# Patient Record
Sex: Female | Born: 1990 | Race: White | Hispanic: No | Marital: Single | Smoking: Never smoker
Health system: Southern US, Community
[De-identification: ages and names within clinical notes are randomized; demographics above are authoritative.]

---

## 2019-06-27 ENCOUNTER — Emergency Department: Payer: Self-pay

## 2019-06-27 ENCOUNTER — Encounter: Payer: Self-pay | Admitting: Emergency Medicine

## 2019-06-27 ENCOUNTER — Other Ambulatory Visit: Payer: Self-pay

## 2019-06-27 ENCOUNTER — Inpatient Hospital Stay
Admission: EM | Admit: 2019-06-27 | Discharge: 2019-06-30 | DRG: 563 | Disposition: A | Discharge status: Home / Self Care | Payer: Self-pay | Attending: Orthopedic Surgery | Admitting: Orthopedic Surgery

## 2019-06-27 DIAGNOSIS — W010XXA Fall on same level from slipping, tripping and stumbling without subsequent striking against object, initial encounter: Secondary | ICD-10-CM | POA: Diagnosis present

## 2019-06-27 DIAGNOSIS — S43004A Unspecified dislocation of right shoulder joint, initial encounter: Secondary | ICD-10-CM

## 2019-06-27 DIAGNOSIS — Y93E2 Activity, laundry: Secondary | ICD-10-CM

## 2019-06-27 DIAGNOSIS — Y92009 Unspecified place in unspecified non-institutional (private) residence as the place of occurrence of the external cause: Secondary | ICD-10-CM

## 2019-06-27 DIAGNOSIS — R202 Paresthesia of skin: Secondary | ICD-10-CM | POA: Diagnosis present

## 2019-06-27 DIAGNOSIS — S43014A Anterior dislocation of right humerus, initial encounter: Secondary | ICD-10-CM | POA: Diagnosis present

## 2019-06-27 DIAGNOSIS — Z419 Encounter for procedure for purposes other than remedying health state, unspecified: Secondary | ICD-10-CM

## 2019-06-27 DIAGNOSIS — S4291XA Fracture of right shoulder girdle, part unspecified, initial encounter for closed fracture: Principal | ICD-10-CM | POA: Diagnosis present

## 2019-06-27 DIAGNOSIS — Z20822 Contact with and (suspected) exposure to covid-19: Secondary | ICD-10-CM | POA: Diagnosis present

## 2019-06-27 DIAGNOSIS — S42141A Displaced fracture of glenoid cavity of scapula, right shoulder, initial encounter for closed fracture: Secondary | ICD-10-CM | POA: Diagnosis present

## 2019-06-27 MED ORDER — LIDOCAINE HCL (PF) 1 % IJ SOLN
5.0000 mL | Freq: Once | INTRAMUSCULAR | Status: AC
  Administered 2019-06-27: 23:00:00 5 mL via INTRADERMAL
  Filled 2019-06-27: qty 5

## 2019-06-27 MED ORDER — MORPHINE SULFATE (PF) 4 MG/ML IV SOLN
4.0000 mg | Freq: Once | INTRAVENOUS | Status: AC
  Administered 2019-06-28: 4 mg via INTRAVENOUS

## 2019-06-27 MED ORDER — MORPHINE SULFATE (PF) 4 MG/ML IV SOLN
4.0000 mg | INTRAVENOUS | Status: DC | PRN
  Administered 2019-06-27 (×2): 4 mg via INTRAVENOUS
  Filled 2019-06-27 (×3): qty 1

## 2019-06-27 MED ORDER — ETOMIDATE 2 MG/ML IV SOLN
0.1500 mg/kg | Freq: Once | INTRAVENOUS | Status: AC
  Administered 2019-06-27: 8.86 mg via INTRAVENOUS
  Filled 2019-06-27: qty 10

## 2019-06-27 MED ORDER — ONDANSETRON HCL 4 MG/2ML IJ SOLN
4.0000 mg | Freq: Once | INTRAMUSCULAR | Status: AC
  Administered 2019-06-27: 4 mg via INTRAVENOUS
  Filled 2019-06-27: qty 2

## 2019-06-27 NOTE — ED Notes (Signed)
2 unsuccessful IV attempts by this RN (left lateral and medial AC).

## 2019-06-27 NOTE — ED Triage Notes (Signed)
Patient ambulatory to triage with steady gait, without difficulty or distress noted, mask in place; pt reports tripped carrying laundry, stuck rt arm out and injured rt shoulder; denies any other c/o or injuries

## 2019-06-27 NOTE — ED Provider Notes (Signed)
[facility] Emergency Department Provider Note    First MD Initiated Contact with Patient 06/27/19 2208     (approximate)  I have reviewed the triage vital signs and the nursing notes.   HISTORY  Chief Complaint Shoulder Injury    HPI Adrienne [name] is a 29 y.o. female presents to the ER for acute right shoulder pain that occurred a mechanical fall falling onto her right outstretched arm while carrying laundry.  Denies any head injury.  Rates the pain currently is mild to moderate.  Did not take anything for pain.  Denies any numbness or tingling.  She is right-hand dominant.  States she does have a remote history of surgery to the right shoulder.    History reviewed. No pertinent past medical history. No family history on file. History reviewed. No pertinent surgical history. There are no problems to display for this patient.     Prior to Admission medications   Not on File    Allergies Patient has no known allergies.    Social History Social History   Tobacco Use  . Smoking status: Never Smoker  . Smokeless tobacco: Never Used  Substance Use Topics  . Alcohol use: Not on file  . Drug use: Not on file    Review of Systems Patient denies headaches, rhinorrhea, blurry vision, numbness, shortness of breath, chest pain, edema, cough, abdominal pain, nausea, vomiting, diarrhea, dysuria, fevers, rashes or hallucinations unless otherwise stated above in HPI. ____________________________________________   PHYSICAL EXAM:  VITAL SIGNS: Vitals:   06/27/19 2358 06/28/19 0007  BP: (!) 137/98 94/82  Pulse: 88 81  Resp: 19 12  Temp: 97.9 F (36.6 C)   SpO2: 100% 100%    Constitutional: Alert and oriented.  Eyes: Conjunctivae are normal.  Head: Atraumatic. Nose: No congestion/rhinnorhea. Mouth/Throat: Mucous membranes are moist.   Neck: No stridor. Painless ROM.  Cardiovascular: Normal rate, regular rhythm. Grossly normal heart  sounds.  Good peripheral circulation. Respiratory: Normal respiratory effort.  No retractions. Lungs CTAB. Gastrointestinal: Soft and nontender.  Genitourinary:  Musculoskeletal: Right shoulder with deformity noted.  No effusion.  Neurovascular intact distally.  No elbow pain.  No lower extremity tenderness nor edema.  No joint effusions. Neurologic:  Normal speech and language. No gross focal neurologic deficits are appreciated. No facial droop Skin:  Skin is warm, dry and intact. No rash noted. Psychiatric: Mood and affect are normal. Speech and behavior are normal.  ____________________________________________   LABS (all labs ordered are listed, but only abnormal results are displayed)  No results found for this or any previous visit (from the past 24 hour(s)). ____________________________________________  EKG____________________________________________  RADIOLOGY  I personally reviewed all radiographic images ordered to evaluate for the above acute complaints and reviewed radiology reports and findings.  These findings were personally discussed with the patient.  Please see medical record for radiology report.  ____________________________________________   PROCEDURES  Procedure(s) performed:  .Ortho Injury Treatment  Date/Time: 06/28/2019 12:24 AM Performed by: Willy Eddy, MD Authorized by: Willy Eddy, MD   Consent:    Consent obtained:  Written   Consent given by:  Patient   Risks discussed:  Irreducible dislocation, fracture, recurrent dislocation, stiffness, restricted joint movement, vascular damage and nerve damage   Alternatives discussed:  Alternative treatment, immobilization, referral, no treatment and delayed treatmentInjury location: shoulder Location details: right shoulder Injury type: dislocation Dislocation type: inferior Hill-Sachs deformity: no Chronicity: new Pre-procedure neurovascular assessment: neurovascularly intact Pre-procedure  distal perfusion: normal Pre-procedure neurological  function: normal Pre-procedure range of motion: reduced  Anesthesia: Local anesthesia used: no  Patient sedated: Yes. Refer to sedation procedure documentation for details of sedation. Manipulation performed: yes Reduction method: external rotation, Milch technique, scapular manipulation, traction and counter traction and Stimson maneuver Reduction successful: no Post-procedure distal perfusion: normal Post-procedure neurological function: normal Post-procedure range of motion: unchanged  .Sedation  Date/Time: 06/28/2019 12:25 AM Performed by: Merlyn Lot, MD Authorized by: Merlyn Lot, MD   Consent:    Consent obtained:  Written (electronic informed consent)   Risks discussed:  Allergic reaction, dysrhythmia, inadequate sedation, nausea, vomiting, respiratory compromise necessitating ventilatory assistance and intubation, prolonged sedation necessitating reversal and prolonged hypoxia resulting in organ damage Universal protocol:    Procedure explained and questions answered to patient or proxy's satisfaction: yes     Relevant documents present and verified: yes     Test results available and properly labeled: yes     Imaging studies available: yes     Required blood products, implants, devices, and special equipment available: yes     Immediately prior to procedure a time out was called: yes     Patient identity confirmation method:  Arm band Pre-sedation assessment:    Time since last food or drink:  6   ASA classification: class 1 - normal, healthy patient     Neck mobility: normal     Mouth opening:  2 finger widths   Thyromental distance:  2 finger widths   Mallampati score:  II - soft palate, uvula, fauces visible   Pre-sedation assessments completed and reviewed: airway patency, cardiovascular function, hydration status, mental status, nausea/vomiting, pain level, respiratory function and temperature     Immediate pre-procedure details:    Reassessment: Patient reassessed immediately prior to procedure     Reviewed: vital signs, relevant labs/tests and NPO status     Verified: bag valve mask available, emergency equipment available, intubation equipment available, IV patency confirmed, oxygen available, reversal medications available and suction available   Procedure details (see MAR for exact dosages):    Intended level of sedation: moderate (conscious sedation)   Intra-procedure monitoring:  Blood pressure monitoring, continuous pulse oximetry, cardiac monitor, frequent vital sign checks and frequent LOC assessments   Total Provider sedation time (minutes):  5 Post-procedure details:    Attendance: Constant attendance by certified staff until patient recovered     Recovery: Patient returned to pre-procedure baseline     Post-sedation assessments completed and reviewed: airway patency, cardiovascular function, hydration status, mental status and respiratory function     Patient is stable for discharge or admission: yes     Patient tolerance:  Tolerated well, no immediate complications      Critical Care performed: no ____________________________________________   INITIAL IMPRESSION / ASSESSMENT AND PLAN / ED COURSE  Pertinent labs & imaging results that were available during my care of the patient were reviewed by me and considered in my medical decision making (see chart for details).   DDX: fracture, dislocation, contusion  Fara Worthy is a 29 y.o. who presents to the ED with evidence of right shoulder dislocation as described above.  She is neurovascularly intact.  No other associated injury.  IV pain medication given attempted scapular manipulation as well as Stimson maneuver however after repeated attempts patient was unable to be relocated.  Was able to be temporarily relocated but it did not seem to stay in place therefore we discussed options for treatment including  splinting for referral additional pain medication for  additional reduction attempts versus sedation.  Patient consented to proceed with sedation.  Clinical Course as of Jun 27 25  Fri Jun 28, 2019  0020 Patient with persistent dislocation that I am unable to maintain in position.  We can successfully get relocated temporarily but that seems like there is nothing holding it in place.  She remains neurovascularly intact do not see any evidence of clear fracture of the proximal humerus possible glenoid impaction fracture.  Discussed case with Dr. Martha Clan of orthopedics who agrees to admit for operative management.   [PR]    Clinical Course User Index [PR] Willy Eddy, MD    The patient was evaluated in Emergency Department today for the symptoms described in the history of present illness. He/she was evaluated in the context of the global COVID-19 pandemic, which necessitated consideration that the patient might be at risk for infection with the SARS-CoV-2 virus that causes COVID-19. Institutional protocols and algorithms that pertain to the evaluation of patients at risk for COVID-19 are in a state of rapid change based on information released by regulatory bodies including the CDC and federal and state organizations. These policies and algorithms were followed during the patient's care in the ED.  As part of my medical decision making, I reviewed the following data within the electronic MEDICAL RECORD NUMBER Nursing notes reviewed and incorporated, Labs reviewed, notes from prior ED visits and [place] Controlled Substance Database   ____________________________________________   FINAL CLINICAL IMPRESSION(S) / ED DIAGNOSES  Final diagnoses:  Traumatic closed displaced fracture of right shoulder with anterior dislocation, initial encounter      NEW MEDICATIONS STARTED DURING THIS VISIT:  New Prescriptions   No medications on file     Note:  This document was prepared using Dragon voice  recognition software and may include unintentional dictation errors.    Willy Eddy, MD 06/28/19 [phone]

## 2019-06-28 ENCOUNTER — Observation Stay: Payer: Self-pay

## 2019-06-28 ENCOUNTER — Observation Stay: Payer: Self-pay | Admitting: Anesthesiology

## 2019-06-28 ENCOUNTER — Encounter: Payer: Self-pay | Admitting: Orthopedic Surgery

## 2019-06-28 ENCOUNTER — Emergency Department: Payer: Self-pay

## 2019-06-28 ENCOUNTER — Encounter
Admission: EM | Disposition: A | Discharge status: Home / Self Care | Payer: Self-pay | Source: Home / Self Care | Attending: Orthopedic Surgery

## 2019-06-28 DIAGNOSIS — S43014A Anterior dislocation of right humerus, initial encounter: Secondary | ICD-10-CM | POA: Diagnosis present

## 2019-06-28 HISTORY — PX: SHOULDER CLOSED REDUCTION: SHX1051

## 2019-06-28 LAB — COMPREHENSIVE METABOLIC PANEL
ALT: 8 U/L (ref 0–44)
AST: 16 U/L (ref 15–41)
Albumin: 4 g/dL (ref 3.5–5.0)
Alkaline Phosphatase: 76 U/L (ref 38–126)
Anion gap: 6 (ref 5–15)
BUN: 9 mg/dL (ref 6–20)
CO2: 24 mmol/L (ref 22–32)
Calcium: 8.8 mg/dL — ABNORMAL LOW (ref 8.9–10.3)
Chloride: 108 mmol/L (ref 98–111)
Creatinine, Ser: 0.48 mg/dL (ref 0.44–1.00)
GFR calc Af Amer: >60 mL/min (ref >60)
GFR calc non Af Amer: >60 mL/min (ref >60)
Glucose, Bld: 99 mg/dL (ref 70–99)
Potassium: 4.1 mmol/L (ref 3.5–5.1)
Sodium: 138 mmol/L (ref 135–145)
Total Bilirubin: 0.4 mg/dL (ref 0.3–1.2)
Total Protein: 6.5 g/dL (ref 6.5–8.1)

## 2019-06-28 LAB — CBC WITH DIFFERENTIAL/PLATELET
Abs Immature Granulocytes: 0.01 10*3/uL (ref 0.00–0.07)
Basophils Absolute: 0 10*3/uL (ref 0.0–0.1)
Basophils Relative: 1 %
Eosinophils Absolute: 0.1 10*3/uL (ref 0.0–0.5)
Eosinophils Relative: 2 %
HCT: 32.1 % — ABNORMAL LOW (ref 36.0–46.0)
Hemoglobin: 9.9 g/dL — ABNORMAL LOW (ref 12.0–15.0)
Immature Granulocytes: 0 %
Lymphocytes Relative: 35 %
Lymphs Abs: 2.3 10*3/uL (ref 0.7–4.0)
MCH: 27.7 pg (ref 26.0–34.0)
MCHC: 30.8 g/dL (ref 30.0–36.0)
MCV: 89.7 fL (ref 80.0–100.0)
Monocytes Absolute: 0.3 10*3/uL (ref 0.1–1.0)
Monocytes Relative: 4 %
Neutro Abs: 3.8 10*3/uL (ref 1.7–7.7)
Neutrophils Relative %: 58 %
Platelets: 473 10*3/uL — ABNORMAL HIGH (ref 150–400)
RBC: 3.58 MIL/uL — ABNORMAL LOW (ref 3.87–5.11)
RDW: 14.5 % (ref 11.5–15.5)
WBC: 6.6 10*3/uL (ref 4.0–10.5)
nRBC: 0 % (ref 0.0–0.2)

## 2019-06-28 LAB — PREGNANCY, URINE: Preg Test, Ur: NEGATIVE

## 2019-06-28 LAB — SARS CORONAVIRUS 2 (TAT 6-24 HRS): SARS Coronavirus 2: NEGATIVE

## 2019-06-28 SURGERY — CLOSED REDUCTION, SHOULDER
Anesthesia: General | Site: Shoulder | Laterality: Right

## 2019-06-28 MED ORDER — HYDROMORPHONE HCL 1 MG/ML IJ SOLN
1.0000 mg | INTRAMUSCULAR | Status: DC | PRN
  Administered 2019-06-28: 1 mg via INTRAVENOUS
  Filled 2019-06-28: qty 1

## 2019-06-28 MED ORDER — LACTATED RINGERS IV BOLUS
1000.0000 mL | Freq: Once | INTRAVENOUS | Status: AC
  Administered 2019-06-28: 1000 mL via INTRAVENOUS

## 2019-06-28 MED ORDER — ONDANSETRON HCL 4 MG/2ML IJ SOLN: 4.0000 mg | Freq: Once | INTRAMUSCULAR | Status: DC | PRN

## 2019-06-28 MED ORDER — FENTANYL CITRATE (PF) 100 MCG/2ML IJ SOLN
INTRAMUSCULAR | Status: DC | PRN
  Administered 2019-06-28 (×2): 50 ug via INTRAVENOUS

## 2019-06-28 MED ORDER — MIDAZOLAM HCL 2 MG/2ML IJ SOLN
INTRAMUSCULAR | Status: DC | PRN
  Administered 2019-06-28: 2 mg via INTRAVENOUS

## 2019-06-28 MED ORDER — SODIUM CHLORIDE 0.9 % IV SOLN: INTRAVENOUS | Status: DC

## 2019-06-28 MED ORDER — HYDROMORPHONE HCL 1 MG/ML IJ SOLN
1.0000 mg | INTRAMUSCULAR | Status: DC | PRN
  Administered 2019-06-28 – 2019-06-30 (×20): 1 mg via INTRAVENOUS
  Filled 2019-06-28 (×23): qty 1

## 2019-06-28 MED ORDER — ACETAMINOPHEN 325 MG PO TABS: 650.0000 mg | ORAL_TABLET | Freq: Four times a day (QID) | ORAL | Status: DC | PRN

## 2019-06-28 MED ORDER — SODIUM CHLORIDE 0.9 % IV SOLN: INTRAVENOUS | Status: DC | PRN

## 2019-06-28 MED ORDER — OXYCODONE HCL 5 MG PO TABS
ORAL_TABLET | ORAL | Status: AC
  Filled 2019-06-28: qty 1

## 2019-06-28 MED ORDER — MORPHINE SULFATE (PF) 2 MG/ML IV SOLN: 2.0000 mg | INTRAVENOUS | Status: DC | PRN

## 2019-06-28 MED ORDER — PROPOFOL 10 MG/ML IV BOLUS
INTRAVENOUS | Status: DC | PRN
  Administered 2019-06-28: 120 mg via INTRAVENOUS
  Administered 2019-06-28: 180 mg via INTRAVENOUS

## 2019-06-28 MED ORDER — OXYCODONE HCL 5 MG PO TABS
5.0000 mg | ORAL_TABLET | Freq: Once | ORAL | Status: AC | PRN
  Administered 2019-06-28: 5 mg via ORAL

## 2019-06-28 MED ORDER — OXYCODONE HCL 5 MG/5ML PO SOLN: 5.0000 mg | Freq: Once | ORAL | Status: AC | PRN

## 2019-06-28 MED ORDER — FENTANYL CITRATE (PF) 100 MCG/2ML IJ SOLN
INTRAMUSCULAR | Status: AC
  Administered 2019-06-28: 15:00:00 25 ug via INTRAVENOUS
  Filled 2019-06-28: qty 2

## 2019-06-28 MED ORDER — OXYCODONE HCL 5 MG PO TABS
5.0000 mg | ORAL_TABLET | ORAL | Status: DC | PRN
  Administered 2019-06-28: 10 mg via ORAL
  Administered 2019-06-28: 5 mg via ORAL
  Administered 2019-06-29 (×2): 10 mg via ORAL
  Filled 2019-06-28 (×3): qty 2
  Filled 2019-06-28: qty 1

## 2019-06-28 MED ORDER — FENTANYL CITRATE (PF) 100 MCG/2ML IJ SOLN
INTRAMUSCULAR | Status: AC
  Filled 2019-06-28: qty 2

## 2019-06-28 MED ORDER — ACETAMINOPHEN 10 MG/ML IV SOLN: 1000.0000 mg | Freq: Once | INTRAVENOUS | Status: DC | PRN

## 2019-06-28 MED ORDER — FENTANYL CITRATE (PF) 100 MCG/2ML IJ SOLN
25.0000 ug | INTRAMUSCULAR | Status: AC | PRN
  Administered 2019-06-28 (×4): 25 ug via INTRAVENOUS

## 2019-06-28 MED ORDER — ETOMIDATE 2 MG/ML IV SOLN
8.8600 mg | Freq: Once | INTRAVENOUS | Status: AC
  Administered 2019-06-28: 8.86 mg via INTRAVENOUS

## 2019-06-28 MED ORDER — MIDAZOLAM HCL 2 MG/2ML IJ SOLN
INTRAMUSCULAR | Status: AC
  Filled 2019-06-28: qty 2

## 2019-06-28 MED ORDER — MORPHINE BOLUS VIA INFUSION: 2.0000 mg | INTRAVENOUS | Status: DC | PRN

## 2019-06-28 MED ORDER — FENTANYL CITRATE (PF) 100 MCG/2ML IJ SOLN
INTRAMUSCULAR | Status: AC
  Administered 2019-06-28: 16:00:00 25 ug via INTRAVENOUS
  Filled 2019-06-28: qty 2

## 2019-06-28 MED ORDER — DIPHENHYDRAMINE HCL 25 MG PO CAPS: 25.0000 mg | ORAL_CAPSULE | Freq: Four times a day (QID) | ORAL | Status: DC | PRN

## 2019-06-28 MED ORDER — SUCCINYLCHOLINE CHLORIDE 20 MG/ML IJ SOLN
INTRAMUSCULAR | Status: DC | PRN
  Administered 2019-06-28: 100 mg via INTRAVENOUS

## 2019-06-28 SURGICAL SUPPLY — 7 items
CRADLE LAMINECT ARM (MISCELLANEOUS) ×6 IMPLANT
KIT TURNOVER KIT A (KITS) ×3 IMPLANT
MASK FACE SPIDER DISP (MASK) ×3 IMPLANT
SLING ARM LRG DEEP (SOFTGOODS) IMPLANT
SLING ARM M TX990204 (SOFTGOODS) IMPLANT
SLING ULTRA II M (MISCELLANEOUS) IMPLANT
STRAP SAFETY 5IN WIDE (MISCELLANEOUS) ×3 IMPLANT

## 2019-06-28 NOTE — H&P (Signed)
PREOPERATIVE H&P  Chief Complaint: Right Dislocated Shoulder with glenoid fracture  HPI: Adrienne [name] is a 29 y.o. female who presents for preoperative history and physical with a diagnosis of a right dislocated shoulder after a fall last night at home.  Patient had close reduction attempts of her right shoulder by the ER staff.  Orthopedics was consulted when the shoulder reduction could not be maintained.  A CT scan in the emergency department following reductions demonstrate the shoulder remains inferiorly subluxed.  There is comminuted fracture of the inferior glenoid contributing to the subluxation.  Patient continues to complain of right shoulder pain and feels that her shoulder remains dislocated.     History reviewed. No pertinent past medical history. History reviewed. No pertinent surgical history. Social History   Socioeconomic History  . Marital status: Single    Spouse name: Not on file  . Number of children: Not on file  . Years of education: Not on file  . Highest education level: Not on file  Occupational History  . Not on file  Tobacco Use  . Smoking status: Never Smoker  . Smokeless tobacco: Never Used  Substance and Sexual Activity  . Alcohol use: Not on file  . Drug use: Not on file  . Sexual activity: Not on file  Other Topics Concern  . Not on file  Social History Narrative  . Not on file   Social Determinants of Health   Financial Resource Strain:   . Difficulty of Paying Living Expenses:   Food Insecurity:   . Worried About Charity fundraiser in the Last Year:   . Arboriculturist in the Last Year:   Transportation Needs:   . Film/video editor (Medical):   Marland Kitchen Lack of Transportation (Non-Medical):   Physical Activity:   . Days of Exercise per Week:   . Minutes of Exercise per Session:   Stress:   . Feeling of Stress :   Social Connections:   . Frequency of Communication with Friends and Family:   . Frequency of Social Gatherings with  Friends and Family:   . Attends Religious Services:   . Active Member of Clubs or Organizations:   . Attends Archivist Meetings:   Marland Kitchen Marital Status:    History reviewed. No pertinent family history. No Known Allergies Prior to Admission medications   Not on File     Positive ROS: All other systems have been reviewed and were otherwise negative with the exception of those mentioned in the HPI and as above.  Physical Exam: General: Patient tearful but alert, no acute distress Cardiovascular: Regular rate and rhythm, no murmurs rubs or gallops.  No pedal edema Respiratory: Clear to auscultation bilaterally, no wheezes rales or rhonchi. No cyanosis, no use of accessory musculature GI: No organomegaly, abdomen is soft and non-tender nondistended with positive bowel sounds. Skin: Skin intact, no lesions within the operative field. Neurologic: Sensation intact distally Psychiatric: Patient is competent for consent with normal mood and affect Lymphatic: No cervical lymphadenopathy  MUSCULOSKELETAL: Right shoulder: Patient has previous arthroscopy incisions which are well-healed.  Patient demonstrates intact skin.  There is no obvious deformity to the right shoulder.  Patient has a sling and swath in place.  Patient has diminished sensation to light touch over the axillary nerve distribution.  Distally the patient can flex and extend all five digits of her right hand.  She can flex and extend her wrist.  Patient has intact sensation light touch  throughout all five fingers of the right hand.  Her hand is well-perfused and she has a palpable radial pulse.  Assessment: Right Shoulder fracture dislocation  Plan: Plan for Procedure(s): CLOSED REDUCTION RIGHT SHOULDER  I reviewed the CT scan and plain x-rays.  I have discussed this case with my partners as this is a very challenging problem.  Patient has a comminuted fracture of the inferior glenoid.  This is contributing to the  patient's inferior subluxation.  I have reviewed plain film and CT images with the patient at the bedside.  I have diagrammed on the white board in her room the issues with her fracture and how it is contributing to inferior subluxation of the shoulder.  The patient wants to proceed with an attempted closed reduction under anesthesia.  She understands that I have doubts as to whether it is possible to improve the right shoulder position given her glenoid fracture and likely deltoid weakness, as exhibited by the diminished sensation light touch over the axillary nerve distribution.  Nevertheless I have agreed to attempt the reduction.  I will adjust her sling and swath in the ER to help improve the shoulder support.  I spoken with one of my colleagues at [facility], Dr. Vale Haven who is agreed to see the patient in follow-up on Monday.  He will discuss further treatment options including surgical options for the patient at her office visit.    Juanell Fairly, MD   06/28/2019 1:51 PM

## 2019-06-28 NOTE — ED Notes (Signed)
Shoulder immobilizer applied to pt. PT is A&Ox4 in NAD. Respirations reg, unlab

## 2019-06-28 NOTE — Progress Notes (Signed)
Pt states she is 8 out of 10  When not stimulated she is able to sleep  Facial pain level 0 out of 10 upon departure to 156

## 2019-06-28 NOTE — Progress Notes (Addendum)
Subjective:  POSTOP CHECK:  Patient sitting upright in bed.   She is crying.  She complains of significant pain in the right shoulder.  Patient had improvement in the inferior subluxation of her right shoulder.  Unfortunately the reduction was lost in the PACU.  Objective:   VITALS:   Vitals:   06/28/19 1551 06/28/19 1552 06/28/19 1602 06/28/19 1640  BP:  108/63 107/67 128/81  Pulse: 72 (!) 59 66 73  Resp: 12 10 13 20   Temp:  98.2 F (36.8 C)  98.1 F (36.7 C)  TempSrc:    Oral  SpO2: 99% 97% 98% 97%  Weight:      Height:        PHYSICAL EXAM: right extremity in sling and swath.  Skin intact.  Moves all fingers. Neurovascular intact.  Still has paresthesias over lateral right shoulder in axillary nerve distribution which was present prior to her close reduction.  LABS  Results for orders placed or performed during the hospital encounter of 06/27/19 (from the past 24 hour(s))  CBC with Differential/Platelet     Status: Abnormal   Collection Time: 06/28/19 12:34 AM  Result Value Ref Range   WBC 6.6 4.0 - 10.5 K/uL   RBC 3.58 (L) 3.87 - 5.11 MIL/uL   Hemoglobin 9.9 (L) 12.0 - 15.0 g/dL   HCT 32.1 (L) 36.0 - 46.0 %   MCV 89.7 80.0 - 100.0 fL   MCH 27.7 26.0 - 34.0 pg   MCHC 30.8 30.0 - 36.0 g/dL   RDW 14.5 11.5 - 15.5 %   Platelets 473 (H) 150 - 400 K/uL   nRBC 0.0 0.0 - 0.2 %   Neutrophils Relative % 58 %   Neutro Abs 3.8 1.7 - 7.7 K/uL   Lymphocytes Relative 35 %   Lymphs Abs 2.3 0.7 - 4.0 K/uL   Monocytes Relative 4 %   Monocytes Absolute 0.3 0.1 - 1.0 K/uL   Eosinophils Relative 2 %   Eosinophils Absolute 0.1 0.0 - 0.5 K/uL   Basophils Relative 1 %   Basophils Absolute 0.0 0.0 - 0.1 K/uL   Immature Granulocytes 0 %   Abs Immature Granulocytes 0.01 0.00 - 0.07 K/uL  Comprehensive metabolic panel     Status: Abnormal   Collection Time: 06/28/19 12:34 AM  Result Value Ref Range   Sodium 138 135 - 145 mmol/L   Potassium 4.1 3.5 - 5.1 mmol/L   Chloride 108 98 -  111 mmol/L   CO2 24 22 - 32 mmol/L   Glucose, Bld 99 70 - 99 mg/dL   BUN 9 6 - 20 mg/dL   Creatinine, Ser 0.48 0.44 - 1.00 mg/dL   Calcium 8.8 (L) 8.9 - 10.3 mg/dL   Total Protein 6.5 6.5 - 8.1 g/dL   Albumin 4.0 3.5 - 5.0 g/dL   AST 16 15 - 41 U/L   ALT 8 0 - 44 U/L   Alkaline Phosphatase 76 38 - 126 U/L   Total Bilirubin 0.4 0.3 - 1.2 mg/dL   GFR calc non Af Amer >60 >60 mL/min   GFR calc Af Amer >60 >60 mL/min   Anion gap 6 5 - 15  SARS CORONAVIRUS 2 (TAT 6-24 HRS) Nasopharyngeal Nasopharyngeal Swab     Status: None   Collection Time: 06/28/19 12:34 AM   Specimen: Nasopharyngeal Swab  Result Value Ref Range   SARS Coronavirus 2 NEGATIVE NEGATIVE  Pregnancy, urine     Status: None   Collection Time: 06/28/19  7:45 AM  Result Value Ref Range   Preg Test, Ur NEGATIVE NEGATIVE    DG Shoulder Right  Result Date: 06/28/2019 CLINICAL DATA:  Anterior dislocation of right shoulder. EXAM: RIGHT SHOULDER - 2+ VIEW COMPARISON:  Multiple x-rays since June 27, 2019. FINDINGS: There is significant inferior subluxation of the humeral head, increased since the most recent comparison study. The patient has known inferior glenoid fracture/Bankart lesion is less well appreciated on this single AP view. No anterior or posterior dislocation is identified. IMPRESSION: Significant inferior subluxation of the humeral head, increased since the most recent comparison. No evidence of anterior or posterior dislocation. The patient has known Bankart lesion/fracture is not well appreciated on this study. Electronically Signed   By: Gerome Sam III M.D   On: 06/28/2019 16:51   DG Shoulder Right  Result Date: 06/28/2019 CLINICAL DATA:  Right shoulder dislocation. EXAM: DG C-ARM 1-60 MIN; RIGHT SHOULDER - 2+ VIEW COMPARISON:  Right shoulder radiographs and CT earlier today FLUOROSCOPY TIME:  Fluoroscopy Time:  2.7 seconds Radiation Exposure Index (if provided by the fluoroscopic device): 0.2055 mGy Number of  Acquired Spot Images: 1 FINDINGS: A single spot fluoroscopic image of the right shoulder in the frontal projection demonstrates mild residual inferior subluxation of the humeral head relative to the glenoid, much improved from the prior radiographs. Inferior glenoid fracture is again noted. IMPRESSION: Decreased inferior subluxation of the humeral head relative to the glenoid. Electronically Signed   By: Sebastian Ache M.D.   On: 06/28/2019 15:38   DG Shoulder Right  Result Date: 06/28/2019 CLINICAL DATA:  Preoperative examination for patient with a right shoulder dislocation and glenoid fracture. Initial encounter. EXAM: RIGHT SHOULDER - 2+ VIEW COMPARISON:  CT right shoulder and plain films right shoulder 06/28/2019. FINDINGS: Marked inferior subluxation of the humeral head is unchanged. Impaction fraction inferior glenoid noted. No new abnormality. IMPRESSION: No change in marked inferior subluxation the humeral head on the glenoid and an impaction fracture of the inferior glenoid. Electronically Signed   By: Drusilla Kanner M.D.   On: 06/28/2019 13:42   DG Shoulder Right  Result Date: 06/27/2019 CLINICAL DATA:  Tripped and fell EXAM: RIGHT SHOULDER - 2+ VIEW COMPARISON:  None. FINDINGS: Frontal and transscapular views of the right shoulder demonstrate anterior and inferior glenohumeral dislocation. There is irregularity along the inferior margin of the glenoid suspicious for impaction fracture. Right chest is clear. IMPRESSION: 1. 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 Electronically Signed   By: Sharlet Salina M.D.   On: 06/27/2019 21:51   CT SHOULDER RIGHT WO CONTRAST  Result Date: 06/28/2019 CLINICAL DATA:  Evaluate fracture after dislocation. EXAM: CT OF THE UPPER RIGHT EXTREMITY WITHOUT CONTRAST TECHNIQUE: Multidetector CT imaging of the upper right extremity was performed according to the standard protocol. COMPARISON:  Radiographs earlier today.  FINDINGS: Bones/Joint/Cartilage Inferior subluxation of the humeral head with respect to the glenoid, this may be in part related to joint effusion. Comminuted fracture of the inferior glenoid involving the anterior-inferior as well as inferior cortex. There is some irregularity of the posteroinferior cortex well. No definite Hill-Sachs injury to the humeral head. Cortical thickening of the proximal humerus in the region of the deltoid insertion Ligaments Suboptimally assessed by CT. Muscles and Tendons Edema in the rotator cuff musculature. Rotator cuff tendons are not well assessed. Soft tissues No confluent hematoma. No fracture of included ribs. IMPRESSION: 1. Comminuted fracture of the inferior glenoid involving the anterior-inferior as well as  inferior articular surface. 2. Inferior subluxation of the humeral head with respect to the glenoid, may be in part related to shoulder joint effusion. 3. No definite Hill-Sachs injury to the humeral head. Electronically Signed   By: Narda Rutherford M.D.   On: 06/28/2019 01:09   DG Shoulder Right Port  Result Date: 06/28/2019 CLINICAL DATA:  Right shoulder closed reduction. EXAM: PORTABLE RIGHT SHOULDER COMPARISON:  06/28/2019 at 12:48 p.m. FINDINGS: No residual dislocation. Humeral head is mildly subluxed inferiorly in relation to the glenoid, but not dislocated, and subluxed to a lesser degree than on the earlier exam. No fracture.  AC joint is normally spaced and aligned. IMPRESSION: 1. Less inferior subluxation of the humeral head in relation to the glenoid when compared to the earlier exam. No dislocation. No fracture Electronically Signed   By: Amie Portland M.D.   On: 06/28/2019 15:55   DG Shoulder Right Portable  Result Date: 06/28/2019 CLINICAL DATA:  Dislocation, postreduction. EXAM: PORTABLE RIGHT SHOULDER COMPARISON:  Exam 5 minutes prior. Additional exams yesterday. FINDINGS: Recurrent inferior dislocation of the humeral head with respect to the  glenoid. Bony Bankart on prior imaging is not well visualized on the current exam. IMPRESSION: Recurrent inferior dislocation of the humeral head with respect to the glenoid. Electronically Signed   By: Narda Rutherford M.D.   On: 06/28/2019 00:45   DG Shoulder Right Portable  Result Date: 06/28/2019 CLINICAL DATA:  Shoulder dislocation, postreduction. EXAM: PORTABLE RIGHT SHOULDER COMPARISON:  Radiographs earlier today. FINDINGS: Reduction of prior shoulder dislocation. Bony Bankart fracture on prior exam is not well demonstrated on the provided views. IMPRESSION: Reduction of prior shoulder dislocation. Bony Bankart on prior exam is not well demonstrated. Electronically Signed   By: Narda Rutherford M.D.   On: 06/28/2019 00:44   DG Shoulder Right Portable  Result Date: 06/28/2019 CLINICAL DATA:  Postreduction EXAM: PORTABLE RIGHT SHOULDER COMPARISON:  06/27/2019 FINDINGS: Continued dislocation of the right shoulder, predominantly inferiorly. No visible fracture. IMPRESSION: Continued inferior dislocation. Electronically Signed   By: Charlett Nose M.D.   On: 06/28/2019 00:31   DG C-Arm 1-60 Min  Result Date: 06/28/2019 CLINICAL DATA:  Right shoulder dislocation. EXAM: DG C-ARM 1-60 MIN; RIGHT SHOULDER - 2+ VIEW COMPARISON:  Right shoulder radiographs and CT earlier today FLUOROSCOPY TIME:  Fluoroscopy Time:  2.7 seconds Radiation Exposure Index (if provided by the fluoroscopic device): 0.2055 mGy Number of Acquired Spot Images: 1 FINDINGS: A single spot fluoroscopic image of the right shoulder in the frontal projection demonstrates mild residual inferior subluxation of the humeral head relative to the glenoid, much improved from the prior radiographs. Inferior glenoid fracture is again noted. IMPRESSION: Decreased inferior subluxation of the humeral head relative to the glenoid. Electronically Signed   By: Sebastian Ache M.D.   On: 06/28/2019 15:38    Assessment/Plan: Day of Surgery   Active  Problems:   Anterior dislocation of right shoulder  Patient's glenohumeral joint is very unstable due to her inferior glenoid fracture and deltoid weakness.  Further attempts at closed reduction I do not believe would be successful at this time.  Continue sling and swath.  Continue current pain management.  The patient has been set up to see my colleague at [facility], Dr. Vale Haven on Monday at 1 PM.  Patient was given a CD of her radiology studies to bring with her to her appointment on Monday.      Juanell Fairly , MD 06/28/2019, 5:29 PM

## 2019-06-28 NOTE — Anesthesia Procedure Notes (Signed)
Procedure Name: LMA Insertion Date/Time: 06/28/2019 2:46 PM Performed by: Junious Silk, CRNA Pre-anesthesia Checklist: Patient identified, Patient being monitored, Timeout performed, Emergency Drugs available and Suction available Patient Re-evaluated:Patient Re-evaluated prior to induction Oxygen Delivery Method: Circle system utilized Preoxygenation: Pre-oxygenation with 100% oxygen Induction Type: IV induction Ventilation: Mask ventilation without difficulty LMA: LMA inserted LMA Size: 4.0 Number of attempts: 1 Placement Confirmation: positive ETCO2 and breath sounds checked- equal and bilateral Tube secured with: Tape Dental Injury: Teeth and Oropharynx as per pre-operative assessment

## 2019-06-28 NOTE — Op Note (Signed)
06/28/2019  3:57 PM  PATIENT:  Adrienne [name]    PRE-OPERATIVE DIAGNOSIS:  Right Dislocated Shoulder  POST-OPERATIVE DIAGNOSIS:  Same  PROCEDURE:  CLOSED REDUCTION RIGHT SHOULDER UNDER ANESTHESIA  SURGEON:  Juanell Fairly, MD  ANESTHESIA:   General  PREOPERATIVE INDICATIONS:  Adrienne [name] is a  29 y.o. female with a diagnosis of Right Dislocated Shoulder who failed closed reduction attempts in the ER overnight.  CT scan shows inferior subluxation with comminuted fracture of the inferior glenoid.  I reviewed these images with the patient.  She wished to proceed with a closed reduction of the right shoulder.  I informed her that reduction may not be possible due to her comminuted inferior glenoid fracture and likely deltoid weakness due to paresthesias in the axillary nerve distribution over her lateral right shoulder on exam.  I discussed the risks and benefits of surgery. The risks include but are not limited to failure to reduce the shoulder, failure of the reduction and the need for further surgery.  Patient understood these risks and wished to proceed.   OPERATIVE PROCEDURE: Patient was brought to the operating room where she was placed supine on the operative table.  She underwent general anesthesia.  Patient underwent a reduction with a superiorly directed force on her right shoulder.  The shoulder was felt to pop into place.  C arm image of the right shoulder demonstrated improved positioning of the right humeral head within the glenoid.  The position of the humeral head was more superior in the glenoid than her previous x-rays in the ED.  Patient had a sling applied along with a swath.  She was awoken and transferred back to her hospital bed and brought to the PACU in stable condition.  I present for the entire case.  Patient did want me to contact anyone following surgery.

## 2019-06-28 NOTE — Anesthesia Preprocedure Evaluation (Signed)
Anesthesia Evaluation  Patient identified by MRN, date of birth, ID band Patient awake    Reviewed: Allergy & Precautions, NPO status , Patient's Chart, lab work & pertinent test results  History of Anesthesia Complications Negative for: history of anesthetic complications  Airway Mallampati: II  TM Distance: >3 FB Neck ROM: Full    Dental no notable dental hx. (+) Teeth Intact, Dental Advisory Given   Pulmonary neg pulmonary ROS, neg sleep apnea, neg COPD, Patient abstained from smoking.Not current smoker,    Pulmonary exam normal breath sounds clear to auscultation       Cardiovascular Exercise Tolerance: Good METS(-) hypertension(-) CAD and (-) Past MI negative cardio ROS  (-) dysrhythmias  Rhythm:Regular Rate:Normal - Systolic murmurs    Neuro/Psych negative neurological ROS  negative psych ROS   GI/Hepatic neg GERD  ,(+)     (-) substance abuse  ,   Endo/Other  neg diabetes  Renal/GU negative Renal ROS     Musculoskeletal Achondroplasia?    Abdominal   Peds  Hematology   Anesthesia Other Findings History reviewed. No pertinent past medical history  Reproductive/Obstetrics                             Anesthesia Physical Anesthesia Plan  ASA: II  Anesthesia Plan: General   Post-op Pain Management:    Induction: Intravenous  PONV Risk Score and Plan: 4 or greater and Ondansetron and Dexamethasone  Airway Management Planned: Mask and Oral ETT  Additional Equipment: None  Intra-op Plan:   Post-operative Plan: Extubation in OR  Informed Consent: I have reviewed the patients History and Physical, chart, labs and discussed the procedure including the risks, benefits and alternatives for the proposed anesthesia with the patient or authorized representative who has indicated his/her understanding and acceptance.     Dental advisory given  Plan Discussed with: CRNA and  Surgeon  Anesthesia Plan Comments: (Mask ventilation vs ETT/LMA depending on surgical factors. Discussed risks of anesthesia with patient, including PONV, sore throat, lip/dental damage. Rare risks discussed as well, such as cardiorespiratory and neurological sequelae. Patient understands.)        Anesthesia Quick Evaluation

## 2019-06-28 NOTE — ED Notes (Signed)
Pt trx to CT.  

## 2019-06-28 NOTE — Transfer of Care (Signed)
Immediate Anesthesia Transfer of Care Note  Patient: Adrienne [name]  Procedure(s) Performed: CLOSED REDUCTION SHOULDER (Right Shoulder)  Patient Location: PACU  Anesthesia Type:General  [name] of Consciousness: awake, alert  and oriented  Airway & Oxygen Therapy: Patient Spontanous Breathing and Patient connected to face mask oxygen  Post-op Assessment: Report given to RN and Post -op Vital signs reviewed and stable  Post vital signs: Reviewed and stable  Last Vitals:  Vitals Value Taken Time  BP 118/67 06/28/19 1504  Temp 36.6 C 06/28/19 1504  Pulse 70 06/28/19 1508  Resp 10 06/28/19 1508  SpO2 98 % 06/28/19 1508  Vitals shown include unvalidated device data.  Last Pain:  Vitals:   06/28/19 1414  TempSrc: Temporal  PainSc: 7       Patients Stated Pain Goal: 5 (06/28/19 0539)  Complications: No apparent anesthesia complications

## 2019-06-28 NOTE — Progress Notes (Signed)
Dr aware of movement   Instructed staff to send pt to room and her shoulder is very unstable and will need more surgery

## 2019-06-28 NOTE — Progress Notes (Signed)
Upon departure to room  Staff removed EKG leads and pt states that we moved her shoulder and hurt her  Called dr Martha Clan called aware of situation and will order another shoulder xray

## 2019-06-29 MED ORDER — TRAMADOL HCL 50 MG PO TABS
50.0000 mg | ORAL_TABLET | Freq: Four times a day (QID) | ORAL | Status: DC
  Administered 2019-06-29 – 2019-06-30 (×4): 50 mg via ORAL
  Filled 2019-06-29 (×4): qty 1

## 2019-06-29 MED ORDER — HYDROMORPHONE HCL 2 MG PO TABS
2.0000 mg | ORAL_TABLET | ORAL | Status: DC | PRN
  Administered 2019-06-30 (×4): 2 mg via ORAL
  Filled 2019-06-29 (×4): qty 1

## 2019-06-29 MED ORDER — ACETAMINOPHEN 500 MG PO TABS
1000.0000 mg | ORAL_TABLET | Freq: Four times a day (QID) | ORAL | Status: DC
  Administered 2019-06-29 – 2019-06-30 (×4): 1000 mg via ORAL
  Filled 2019-06-29 (×4): qty 2

## 2019-06-29 NOTE — Progress Notes (Addendum)
Subjective:  Patient reports pain as marked.  Right shoulder. She is sitting up in bed.  Objective:   VITALS:   Vitals:   06/28/19 2356 06/29/19 0335 06/29/19 0730 06/29/19 1637  BP: 128/80 127/69 119/72 (!) 118/57  Pulse: 71 81 90 84  Resp: 17 16 18 18   Temp: 97.8 F (36.6 C) 98.3 F (36.8 C) 98.1 F (36.7 C) 97.9 F (36.6 C)  TempSrc: Oral Oral    SpO2: 98% 100% 99% 99%  Weight:      Height:        PHYSICAL EXAM:  Neurovascular intact No cellulitis present Compartment soft  LABS  No results found for this or any previous visit (from the past 24 hour(s)).  DG Shoulder Right  Result Date: 06/28/2019 CLINICAL DATA:  Anterior dislocation of right shoulder. EXAM: RIGHT SHOULDER - 2+ VIEW COMPARISON:  Multiple x-rays since June 27, 2019. FINDINGS: There is significant inferior subluxation of the humeral head, increased since the most recent comparison study. The patient has known inferior glenoid fracture/Bankart lesion is less well appreciated on this single AP view. No anterior or posterior dislocation is identified. IMPRESSION: Significant inferior subluxation of the humeral head, increased since the most recent comparison. No evidence of anterior or posterior dislocation. The patient has known Bankart lesion/fracture is not well appreciated on this study. Electronically Signed   By: June 29, 2019 III M.D   On: 06/28/2019 16:51   DG Shoulder Right  Result Date: 06/28/2019 CLINICAL DATA:  Right shoulder dislocation. EXAM: DG C-ARM 1-60 MIN; RIGHT SHOULDER - 2+ VIEW COMPARISON:  Right shoulder radiographs and CT earlier today FLUOROSCOPY TIME:  Fluoroscopy Time:  2.7 seconds Radiation Exposure Index (if provided by the fluoroscopic device): 0.2055 mGy Number of Acquired Spot Images: 1 FINDINGS: A single spot fluoroscopic image of the right shoulder in the frontal projection demonstrates mild residual inferior subluxation of the humeral head relative to the glenoid, much  improved from the prior radiographs. Inferior glenoid fracture is again noted. IMPRESSION: Decreased inferior subluxation of the humeral head relative to the glenoid. Electronically Signed   By: 06/30/2019 M.D.   On: 06/28/2019 15:38   DG Shoulder Right  Result Date: 06/28/2019 CLINICAL DATA:  Preoperative examination for patient with a right shoulder dislocation and glenoid fracture. Initial encounter. EXAM: RIGHT SHOULDER - 2+ VIEW COMPARISON:  CT right shoulder and plain films right shoulder 06/28/2019. FINDINGS: Marked inferior subluxation of the humeral head is unchanged. Impaction fraction inferior glenoid noted. No new abnormality. IMPRESSION: No change in marked inferior subluxation the humeral head on the glenoid and an impaction fracture of the inferior glenoid. Electronically Signed   By: 06/30/2019 M.D.   On: 06/28/2019 13:42   DG Shoulder Right  Result Date: 06/27/2019 CLINICAL DATA:  Tripped and fell EXAM: RIGHT SHOULDER - 2+ VIEW COMPARISON:  None. FINDINGS: Frontal and transscapular views of the right shoulder demonstrate anterior and inferior glenohumeral dislocation. There is irregularity along the inferior margin of the glenoid suspicious for impaction fracture. Right chest is clear. IMPRESSION: 1. 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 Electronically Signed   By: 06/29/2019 M.D.   On: 06/27/2019 21:51   CT SHOULDER RIGHT WO CONTRAST  Result Date: 06/28/2019 CLINICAL DATA:  Evaluate fracture after dislocation. EXAM: CT OF THE UPPER RIGHT EXTREMITY WITHOUT CONTRAST TECHNIQUE: Multidetector CT imaging of the upper right extremity was performed according to the standard protocol. COMPARISON:  Radiographs earlier today. FINDINGS: Bones/Joint/Cartilage Inferior  subluxation of the humeral head with respect to the glenoid, this may be in part related to joint effusion. Comminuted fracture of the inferior glenoid involving the  anterior-inferior as well as inferior cortex. There is some irregularity of the posteroinferior cortex well. No definite Hill-Sachs injury to the humeral head. Cortical thickening of the proximal humerus in the region of the deltoid insertion Ligaments Suboptimally assessed by CT. Muscles and Tendons Edema in the rotator cuff musculature. Rotator cuff tendons are not well assessed. Soft tissues No confluent hematoma. No fracture of included ribs. IMPRESSION: 1. Comminuted fracture of the inferior glenoid involving the anterior-inferior as well as inferior articular surface. 2. Inferior subluxation of the humeral head with respect to the glenoid, may be in part related to shoulder joint effusion. 3. No definite Hill-Sachs injury to the humeral head. Electronically Signed   By: Keith Rake M.D.   On: 06/28/2019 01:09   DG Shoulder Right Port  Result Date: 06/28/2019 CLINICAL DATA:  Right shoulder closed reduction. EXAM: PORTABLE RIGHT SHOULDER COMPARISON:  06/28/2019 at 12:48 p.m. FINDINGS: No residual dislocation. Humeral head is mildly subluxed inferiorly in relation to the glenoid, but not dislocated, and subluxed to a lesser degree than on the earlier exam. No fracture.  AC joint is normally spaced and aligned. IMPRESSION: 1. Less inferior subluxation of the humeral head in relation to the glenoid when compared to the earlier exam. No dislocation. No fracture Electronically Signed   By: Lajean Manes M.D.   On: 06/28/2019 15:55   DG Shoulder Right Portable  Result Date: 06/28/2019 CLINICAL DATA:  Dislocation, postreduction. EXAM: PORTABLE RIGHT SHOULDER COMPARISON:  Exam 5 minutes prior. Additional exams yesterday. FINDINGS: Recurrent inferior dislocation of the humeral head with respect to the glenoid. Bony Bankart on prior imaging is not well visualized on the current exam. IMPRESSION: Recurrent inferior dislocation of the humeral head with respect to the glenoid. Electronically Signed   By: Keith Rake M.D.   On: 06/28/2019 00:45   DG Shoulder Right Portable  Result Date: 06/28/2019 CLINICAL DATA:  Shoulder dislocation, postreduction. EXAM: PORTABLE RIGHT SHOULDER COMPARISON:  Radiographs earlier today. FINDINGS: Reduction of prior shoulder dislocation. Bony Bankart fracture on prior exam is not well demonstrated on the provided views. IMPRESSION: Reduction of prior shoulder dislocation. Bony Bankart on prior exam is not well demonstrated. Electronically Signed   By: Keith Rake M.D.   On: 06/28/2019 00:44   DG Shoulder Right Portable  Result Date: 06/28/2019 CLINICAL DATA:  Postreduction EXAM: PORTABLE RIGHT SHOULDER COMPARISON:  06/27/2019 FINDINGS: Continued dislocation of the right shoulder, predominantly inferiorly. No visible fracture. IMPRESSION: Continued inferior dislocation. Electronically Signed   By: Rolm Baptise M.D.   On: 06/28/2019 00:31   DG C-Arm 1-60 Min  Result Date: 06/28/2019 CLINICAL DATA:  Right shoulder dislocation. EXAM: DG C-ARM 1-60 MIN; RIGHT SHOULDER - 2+ VIEW COMPARISON:  Right shoulder radiographs and CT earlier today FLUOROSCOPY TIME:  Fluoroscopy Time:  2.7 seconds Radiation Exposure Index (if provided by the fluoroscopic device): 0.2055 mGy Number of Acquired Spot Images: 1 FINDINGS: A single spot fluoroscopic image of the right shoulder in the frontal projection demonstrates mild residual inferior subluxation of the humeral head relative to the glenoid, much improved from the prior radiographs. Inferior glenoid fracture is again noted. IMPRESSION: Decreased inferior subluxation of the humeral head relative to the glenoid. Electronically Signed   By: Logan Bores M.D.   On: 06/28/2019 15:38    Assessment/Plan: 1 Day Post-Op  Active Problems:   Anterior dislocation of right shoulder   Plan for discharge tomorrow  Will schedule tylenol and tramadol Change to dilaudid tablets rather than oxycodone Patient will follow-up on Monday as  outpatient   Lyndle Herrlich , MD 06/29/2019, 6:48 PM

## 2019-06-30 MED ORDER — TRAMADOL HCL 50 MG PO TABS: 50.0000 mg | ORAL_TABLET | Freq: Four times a day (QID) | ORAL | 0 refills | Status: AC

## 2019-06-30 MED ORDER — ACETAMINOPHEN 500 MG PO TABS: 1000.0000 mg | ORAL_TABLET | Freq: Four times a day (QID) | ORAL | 0 refills | Status: AC

## 2019-06-30 MED ORDER — HYDROMORPHONE HCL 2 MG PO TABS: 2.0000 mg | ORAL_TABLET | ORAL | 0 refills | Status: AC | PRN

## 2019-06-30 NOTE — Discharge Summary (Signed)
Physician Discharge Summary  Patient ID: Adrienne [name] MRN: [id number] DOB/AGE: [birth year] 29 y.o.  Admit date: 06/27/2019 Discharge date: 06/30/2019  Admission Diagnoses:  Right Dislocated Shoulder <principal problem not specified>  Discharge Diagnoses:  Right Dislocated Shoulder Active Problems:   Anterior dislocation of right shoulder   History reviewed. No pertinent past medical history.  Surgeries: Procedure(s): CLOSED REDUCTION SHOULDER on 06/28/2019   Consultants (if any):   Discharged Condition: Improved  Hospital Course: Adrienne [name] is an 29 y.o. female who was admitted 06/27/2019 with a diagnosis of  Right Dislocated Shoulder <principal problem not specified> and went to the operating room on 06/28/2019 and underwent the above named procedures.    She was given perioperative antibiotics:  Anti-infectives (From admission, onward)   None    .  She was given sequential compression devices and early ambulation for DVT prophylaxis.  She benefited maximally from the hospital stay and there were no complications.    Recent vital signs:  Vitals:   06/30/19 0412 06/30/19 0804  BP: 134/77 (!) 142/76  Pulse: 70 74  Resp: 18 16  Temp: 98.7 F (37.1 C) 98.2 F (36.8 C)  SpO2: 97% 100%    Recent laboratory studies:  Lab Results  Component Value Date   HGB 9.9 (L) 06/28/2019   Lab Results  Component Value Date   WBC 6.6 06/28/2019   PLT 473 (H) 06/28/2019   No results found for: INR Lab Results  Component Value Date   NA 138 06/28/2019   K 4.1 06/28/2019   CL 108 06/28/2019   CO2 24 06/28/2019   BUN 9 06/28/2019   CREATININE 0.48 06/28/2019   GLUCOSE 99 06/28/2019    Discharge Medications:   Allergies as of 06/30/2019   No Known Allergies     Medication List    TAKE these medications   acetaminophen 500 MG tablet Commonly known as: TYLENOL Take 2 tablets (1,000 mg total) by mouth every 6 (six) hours.   HYDROmorphone 2 MG  tablet Commonly known as: DILAUDID Take 1 tablet (2 mg total) by mouth every 4 (four) hours as needed for severe pain.   traMADol 50 MG tablet Commonly known as: ULTRAM Take 1 tablet (50 mg total) by mouth every 6 (six) hours.       Diagnostic Studies: DG Shoulder Right  Result Date: 06/28/2019 CLINICAL DATA:  Anterior dislocation of right shoulder. EXAM: RIGHT SHOULDER - 2+ VIEW COMPARISON:  Multiple x-rays since June 27, 2019. FINDINGS: There is significant inferior subluxation of the humeral head, increased since the most recent comparison study. The patient has known inferior glenoid fracture/Bankart lesion is less well appreciated on this single AP view. No anterior or posterior dislocation is identified. IMPRESSION: Significant inferior subluxation of the humeral head, increased since the most recent comparison. No evidence of anterior or posterior dislocation. The patient has known Bankart lesion/fracture is not well appreciated on this study. Electronically Signed   By: Gerome Sam III M.D   On: 06/28/2019 16:51   DG Shoulder Right  Result Date: 06/28/2019 CLINICAL DATA:  Right shoulder dislocation. EXAM: DG C-ARM 1-60 MIN; RIGHT SHOULDER - 2+ VIEW COMPARISON:  Right shoulder radiographs and CT earlier today FLUOROSCOPY TIME:  Fluoroscopy Time:  2.7 seconds Radiation Exposure Index (if provided by the fluoroscopic device): 0.2055 mGy Number of Acquired Spot Images: 1 FINDINGS: A single spot fluoroscopic image of the right shoulder in the frontal projection demonstrates mild residual inferior subluxation of the humeral head relative to the  glenoid, much improved from the prior radiographs. Inferior glenoid fracture is again noted. IMPRESSION: Decreased inferior subluxation of the humeral head relative to the glenoid. Electronically Signed   By: Logan Bores M.D.   On: 06/28/2019 15:38   DG Shoulder Right  Result Date: 06/28/2019 CLINICAL DATA:  Preoperative examination for patient  with a right shoulder dislocation and glenoid fracture. Initial encounter. EXAM: RIGHT SHOULDER - 2+ VIEW COMPARISON:  CT right shoulder and plain films right shoulder 06/28/2019. FINDINGS: Marked inferior subluxation of the humeral head is unchanged. Impaction fraction inferior glenoid noted. No new abnormality. IMPRESSION: No change in marked inferior subluxation the humeral head on the glenoid and an impaction fracture of the inferior glenoid. Electronically Signed   By: Inge Rise M.D.   On: 06/28/2019 13:42   DG Shoulder Right  Result Date: 06/27/2019 CLINICAL DATA:  Tripped and fell EXAM: RIGHT SHOULDER - 2+ VIEW COMPARISON:  None. FINDINGS: Frontal and transscapular views of the right shoulder demonstrate anterior and inferior glenohumeral dislocation. There is irregularity along the inferior margin of the glenoid suspicious for impaction fracture. Right chest is clear. IMPRESSION: 1. 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 Electronically Signed   By: Randa Ngo M.D.   On: 06/27/2019 21:51   CT SHOULDER RIGHT WO CONTRAST  Result Date: 06/28/2019 CLINICAL DATA:  Evaluate fracture after dislocation. EXAM: CT OF THE UPPER RIGHT EXTREMITY WITHOUT CONTRAST TECHNIQUE: Multidetector CT imaging of the upper right extremity was performed according to the standard protocol. COMPARISON:  Radiographs earlier today. FINDINGS: Bones/Joint/Cartilage Inferior subluxation of the humeral head with respect to the glenoid, this may be in part related to joint effusion. Comminuted fracture of the inferior glenoid involving the anterior-inferior as well as inferior cortex. There is some irregularity of the posteroinferior cortex well. No definite Hill-Sachs injury to the humeral head. Cortical thickening of the proximal humerus in the region of the deltoid insertion Ligaments Suboptimally assessed by CT. Muscles and Tendons Edema in the rotator cuff musculature.  Rotator cuff tendons are not well assessed. Soft tissues No confluent hematoma. No fracture of included ribs. IMPRESSION: 1. Comminuted fracture of the inferior glenoid involving the anterior-inferior as well as inferior articular surface. 2. Inferior subluxation of the humeral head with respect to the glenoid, may be in part related to shoulder joint effusion. 3. No definite Hill-Sachs injury to the humeral head. Electronically Signed   By: Keith Rake M.D.   On: 06/28/2019 01:09   DG Shoulder Right Port  Result Date: 06/28/2019 CLINICAL DATA:  Right shoulder closed reduction. EXAM: PORTABLE RIGHT SHOULDER COMPARISON:  06/28/2019 at 12:48 p.m. FINDINGS: No residual dislocation. Humeral head is mildly subluxed inferiorly in relation to the glenoid, but not dislocated, and subluxed to a lesser degree than on the earlier exam. No fracture.  AC joint is normally spaced and aligned. IMPRESSION: 1. Less inferior subluxation of the humeral head in relation to the glenoid when compared to the earlier exam. No dislocation. No fracture Electronically Signed   By: Lajean Manes M.D.   On: 06/28/2019 15:55   DG Shoulder Right Portable  Result Date: 06/28/2019 CLINICAL DATA:  Dislocation, postreduction. EXAM: PORTABLE RIGHT SHOULDER COMPARISON:  Exam 5 minutes prior. Additional exams yesterday. FINDINGS: Recurrent inferior dislocation of the humeral head with respect to the glenoid. Bony Bankart on prior imaging is not well visualized on the current exam. IMPRESSION: Recurrent inferior dislocation of the humeral head with respect to the glenoid. Electronically Signed  By: Narda Rutherford M.D.   On: 06/28/2019 00:45   DG Shoulder Right Portable  Result Date: 06/28/2019 CLINICAL DATA:  Shoulder dislocation, postreduction. EXAM: PORTABLE RIGHT SHOULDER COMPARISON:  Radiographs earlier today. FINDINGS: Reduction of prior shoulder dislocation. Bony Bankart fracture on prior exam is not well demonstrated on the  provided views. IMPRESSION: Reduction of prior shoulder dislocation. Bony Bankart on prior exam is not well demonstrated. Electronically Signed   By: Narda Rutherford M.D.   On: 06/28/2019 00:44   DG Shoulder Right Portable  Result Date: 06/28/2019 CLINICAL DATA:  Postreduction EXAM: PORTABLE RIGHT SHOULDER COMPARISON:  06/27/2019 FINDINGS: Continued dislocation of the right shoulder, predominantly inferiorly. No visible fracture. IMPRESSION: Continued inferior dislocation. Electronically Signed   By: Charlett Nose M.D.   On: 06/28/2019 00:31   DG C-Arm 1-60 Min  Result Date: 06/28/2019 CLINICAL DATA:  Right shoulder dislocation. EXAM: DG C-ARM 1-60 MIN; RIGHT SHOULDER - 2+ VIEW COMPARISON:  Right shoulder radiographs and CT earlier today FLUOROSCOPY TIME:  Fluoroscopy Time:  2.7 seconds Radiation Exposure Index (if provided by the fluoroscopic device): 0.2055 mGy Number of Acquired Spot Images: 1 FINDINGS: A single spot fluoroscopic image of the right shoulder in the frontal projection demonstrates mild residual inferior subluxation of the humeral head relative to the glenoid, much improved from the prior radiographs. Inferior glenoid fracture is again noted. IMPRESSION: Decreased inferior subluxation of the humeral head relative to the glenoid. Electronically Signed   By: Sebastian Ache M.D.   On: 06/28/2019 15:38    Disposition: Discharge disposition: 01-Home or Self Care            Signed: Lyndle Herrlich ,MD 06/30/2019, 10:50 AM

## 2019-06-30 NOTE — Progress Notes (Signed)
Patient aware of discharge per MD and patient, but has repeatedly voiced concerns of continued lack of pain management.  Pt verbalized understanding the medications being administered by the hospital were already submitted to her pharmacy.  Multiple attempts to get patient to inform writer of her ride status, and voice what she want the doctor to do. Called Dr. Odis Luster and informed him of the situation. PO Dilaudid changing to q 4 hours to match her prescription. [facility] Cheryl informed.  At the bedside, patient informed [facility] she has been calling for a ride and has no one to pick her up.  Patient reports she lives with friends.  Called emergency contact, her mother, but phone does not accept messages.  No answer on other listed phone number.  Will continue to assess.

## 2019-06-30 NOTE — Progress Notes (Signed)
Patient discharge instructions reviewed again with patient.  Patient called an uber. Refused staff assistance to get dressed.  Verbalized going to [employer] before it closes to get medications. Verbalized not understanding why hospital would not transfer her to her appointment tomorrow.  No s/s of distress noted.  Taken to [employer] by Agricultural consultant.  Belongings with patient.

## 2019-06-30 NOTE — Discharge Instructions (Signed)
Shoulder immobilizer all times except hygiene purposes

## 2019-06-30 NOTE — Plan of Care (Signed)
  Dr. Odis Luster in to see patient.  Patient verbalized understanding concerning IV dilaudid effectiveness.  Pt now on PO dilaudid.  Patient to be discharged home today.

## 2019-07-01 NOTE — Anesthesia Postprocedure Evaluation (Signed)
Anesthesia Post Note  Patient: Adrienne [name]  Procedure(s) Performed: CLOSED REDUCTION SHOULDER (Right Shoulder)  Patient location during evaluation: PACU Anesthesia Type: General [name] of consciousness: awake and alert Pain management: pain [name] controlled Vital Signs Assessment: post-procedure vital signs reviewed and stable Respiratory status: spontaneous breathing, nonlabored ventilation, respiratory function stable and patient connected to nasal cannula oxygen Cardiovascular status: blood pressure returned to baseline and stable Postop Assessment: no apparent nausea or vomiting Anesthetic complications: no     Last Vitals:  Vitals:   06/30/19 0412 06/30/19 0804  BP: 134/77 (!) 142/76  Pulse: 70 74  Resp: 18 16  Temp: 37.1 C 36.8 C  SpO2: 97% 100%    Last Pain:  Vitals:   06/30/19 1600  TempSrc:   PainSc: 8                  Corinda Gubler

## 2021-05-27 IMAGING — DX DG SHOULDER 2+V PORT*R*
1 series · 1 of 1 positions shown · non-contrast
Comparison: 06/27/2019

CLINICAL DATA: Postreduction

EXAM:
PORTABLE RIGHT SHOULDER

[shoulder ap]
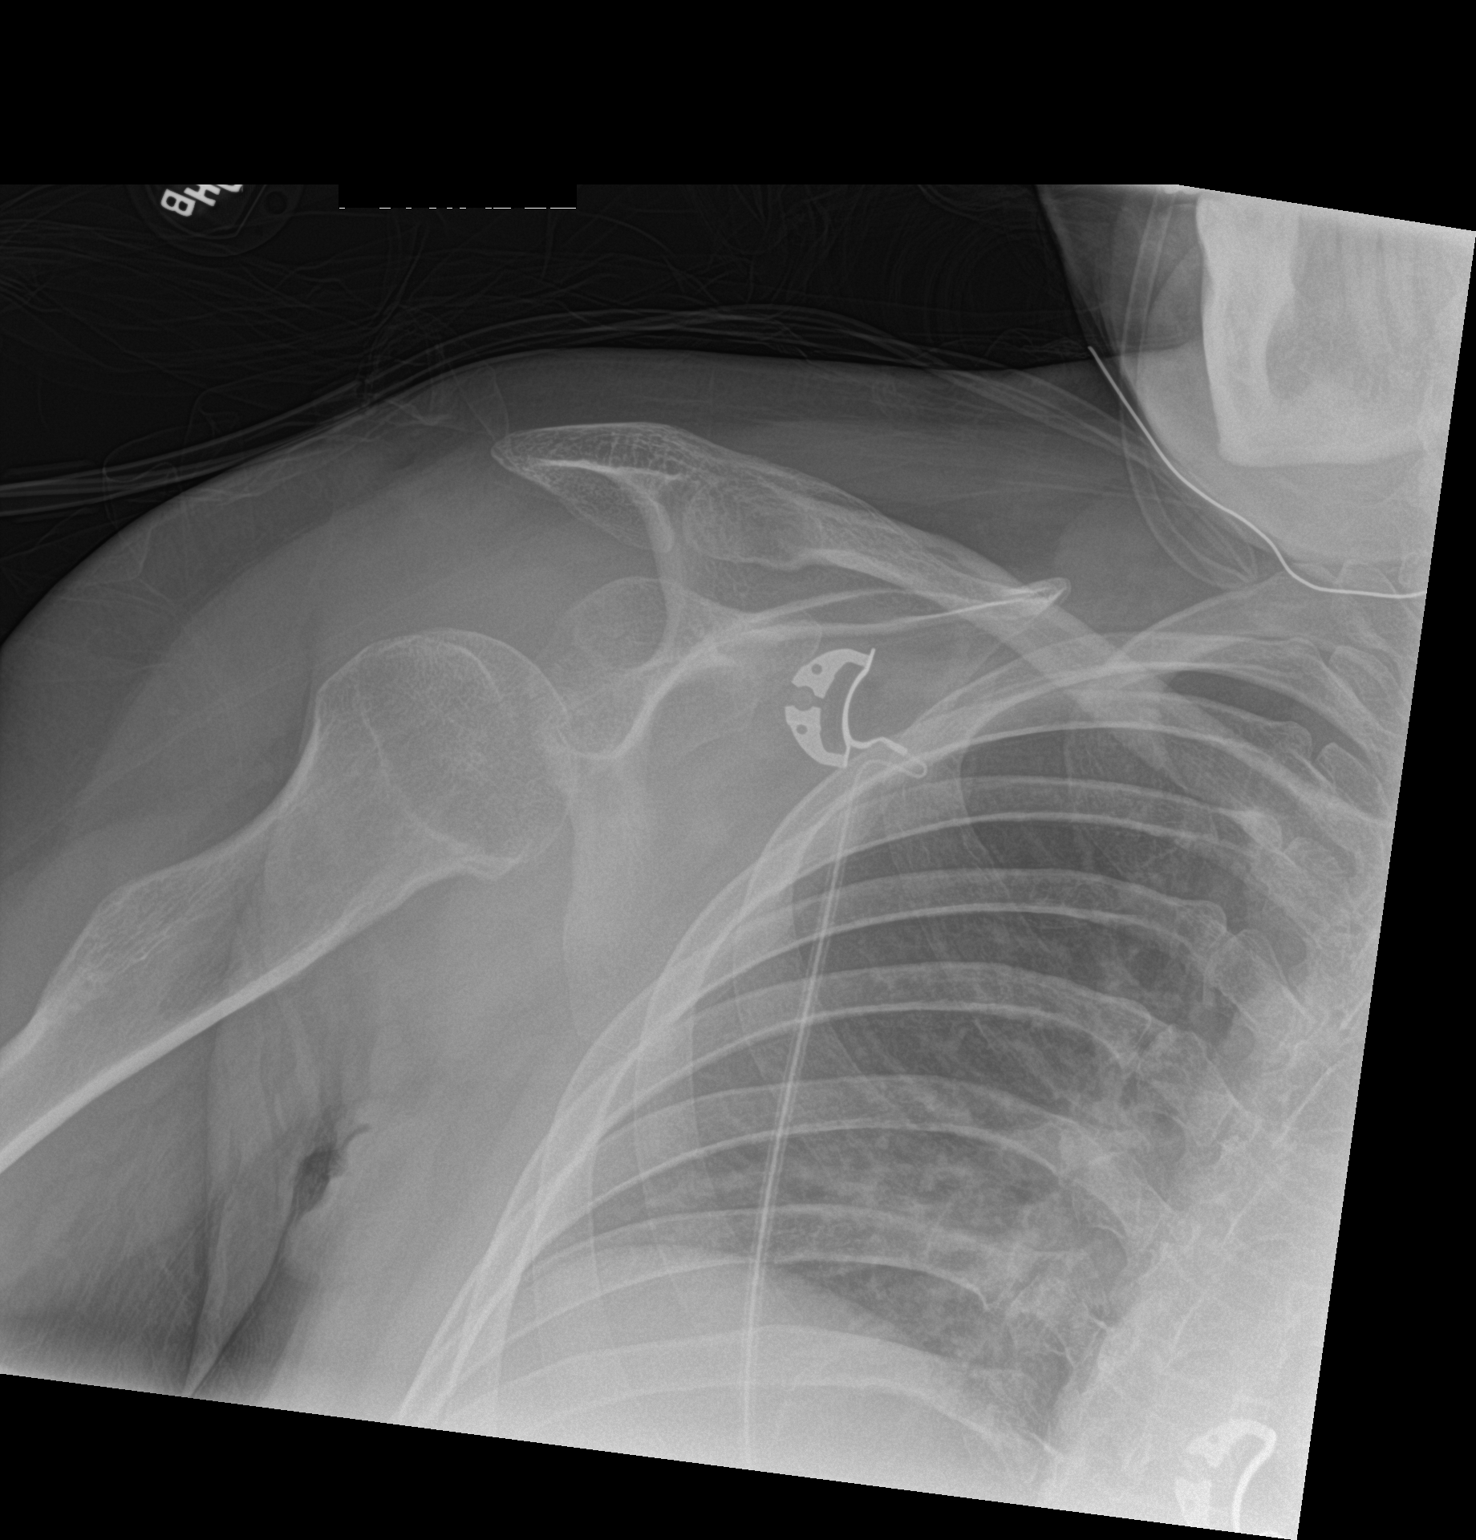

[1 of 1 positions shown; findings below may reference images not displayed]

FINDINGS: Continued dislocation of the right shoulder, predominantly
inferiorly. No visible fracture.
IMPRESSION: Continued inferior dislocation.

## 2021-05-28 IMAGING — RF DG SHOULDER 2+V*R*
1 series · 1 of 1 positions shown · non-contrast
Comparison: Right shoulder radiographs and CT earlier today

CLINICAL DATA: Right shoulder dislocation.

EXAM:
DG C-ARM 1-60 MIN; RIGHT SHOULDER - 2+ VIEW

[Series 1: dg or · 0.14mm/px · 1 of 1 slices shown]
[im 1/1]
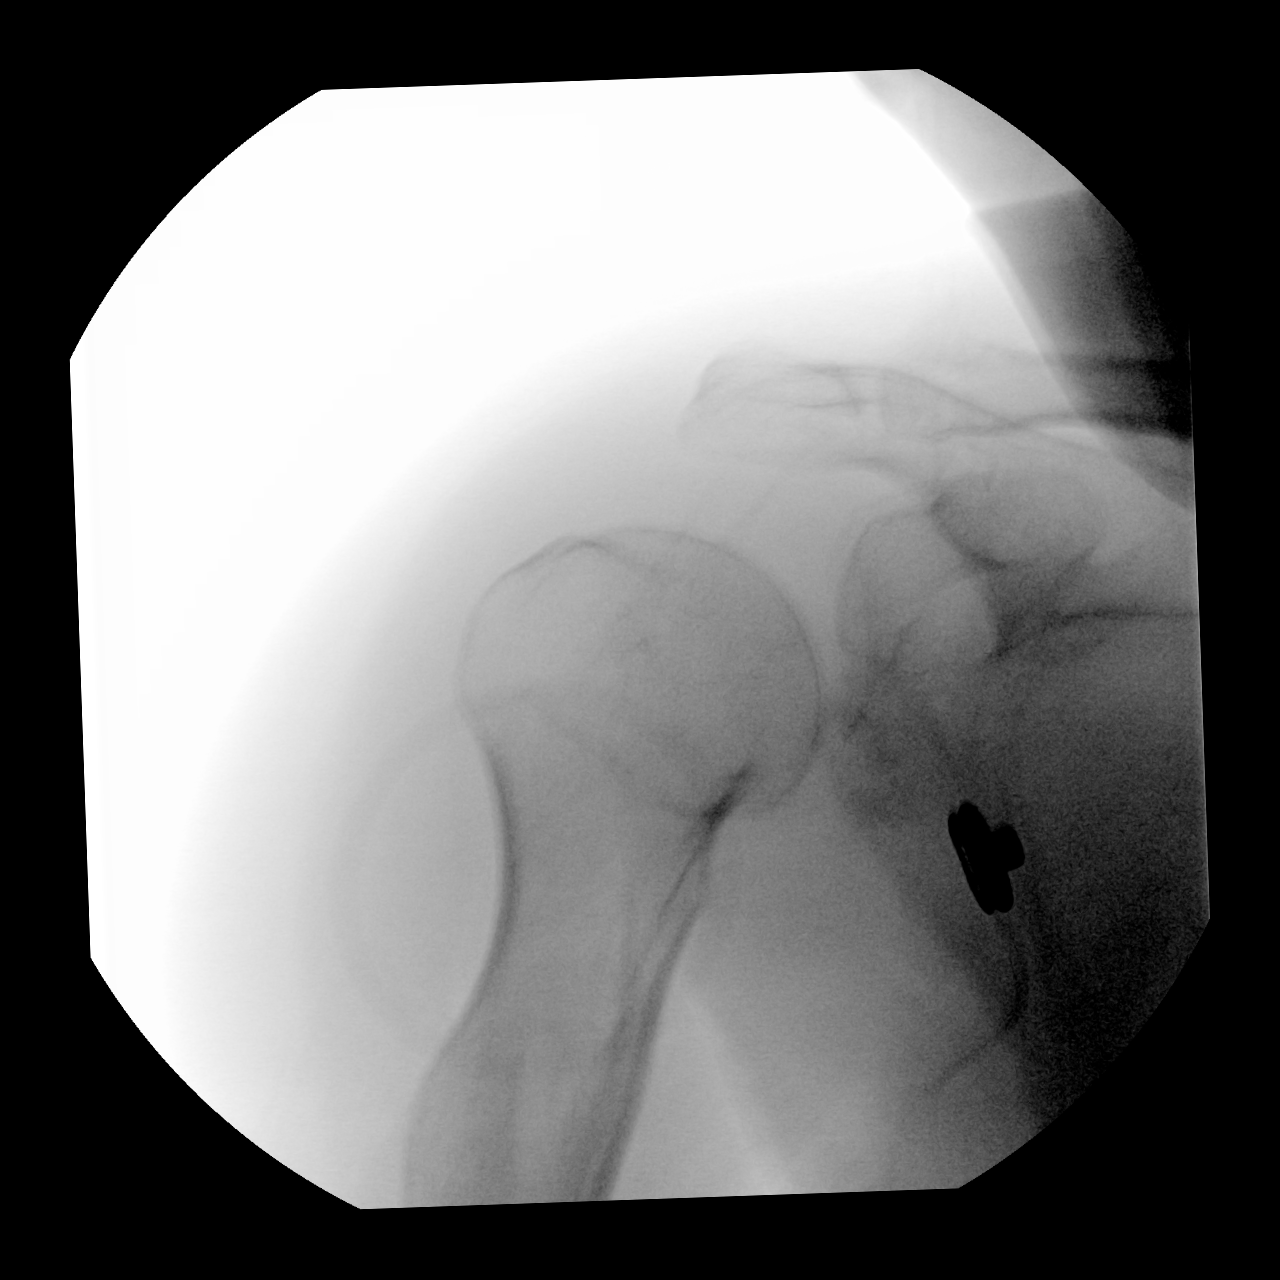

[1 of 1 positions shown; findings below may reference images not displayed]

FLUOROSCOPY TIME:  Fluoroscopy Time:  2.7 seconds

Radiation Exposure Index (if provided by the fluoroscopic device):
0.2055 mGy

Number of Acquired Spot Images: 1
FINDINGS: A single spot fluoroscopic image of the right shoulder in the
frontal projection demonstrates mild residual inferior subluxation
of the humeral head relative to the glenoid, much improved from the
prior radiographs. Inferior glenoid fracture is again noted.
IMPRESSION: Decreased inferior subluxation of the humeral head relative to the
glenoid.

## 2021-05-28 IMAGING — DX DG SHOULDER 2+V PORT*R*
3 series · 4 of 4 positions shown · non-contrast
Comparison: 06/28/2019 at [DATE] p.m.

CLINICAL DATA: Right shoulder closed reduction.

EXAM:
PORTABLE RIGHT SHOULDER

[shoulder ap (1 of 2)]
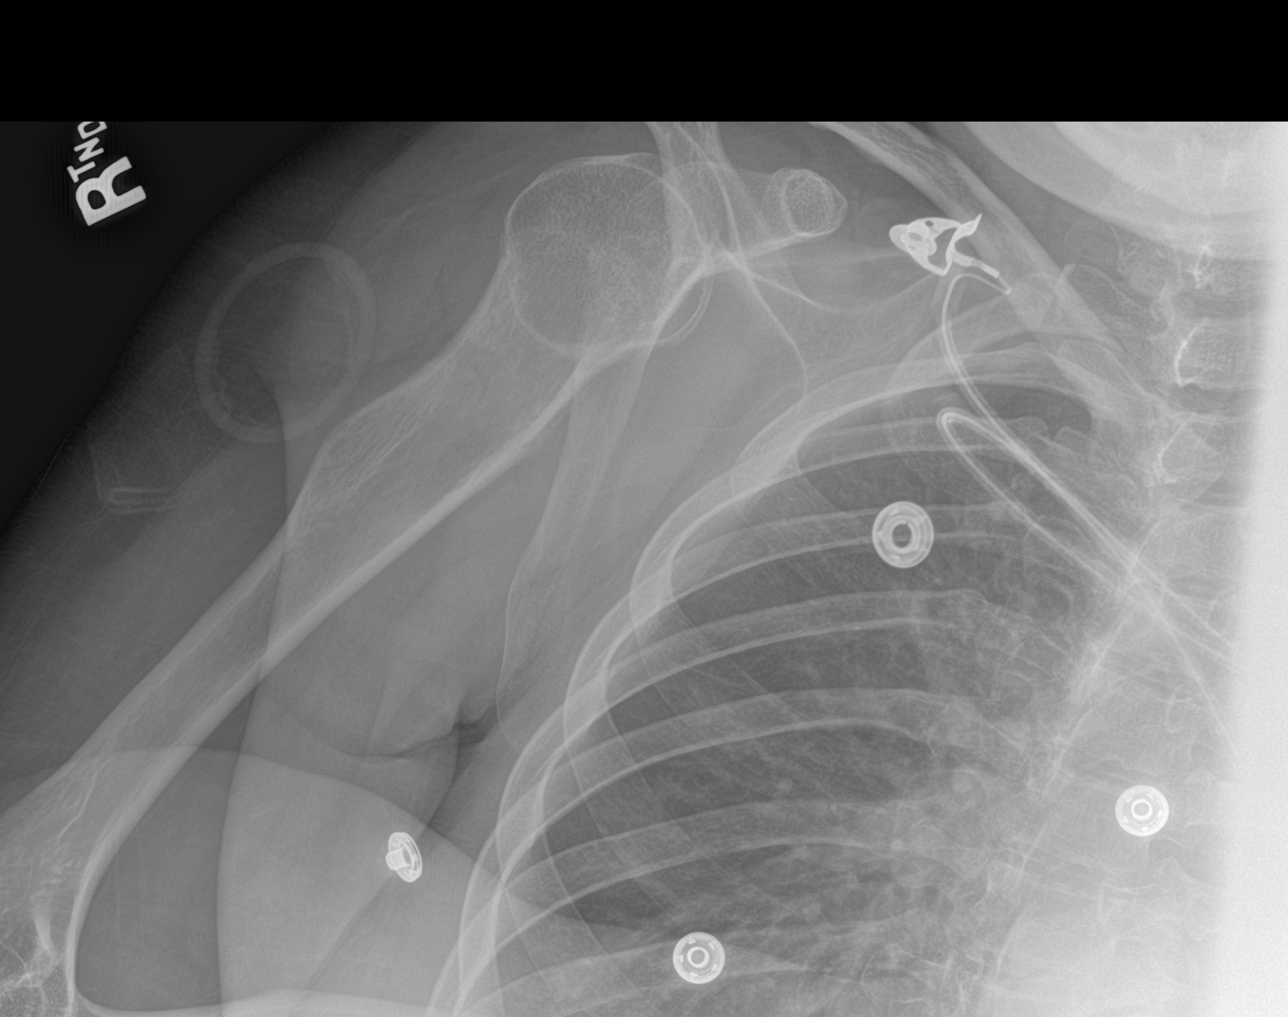

[shoulder obl]
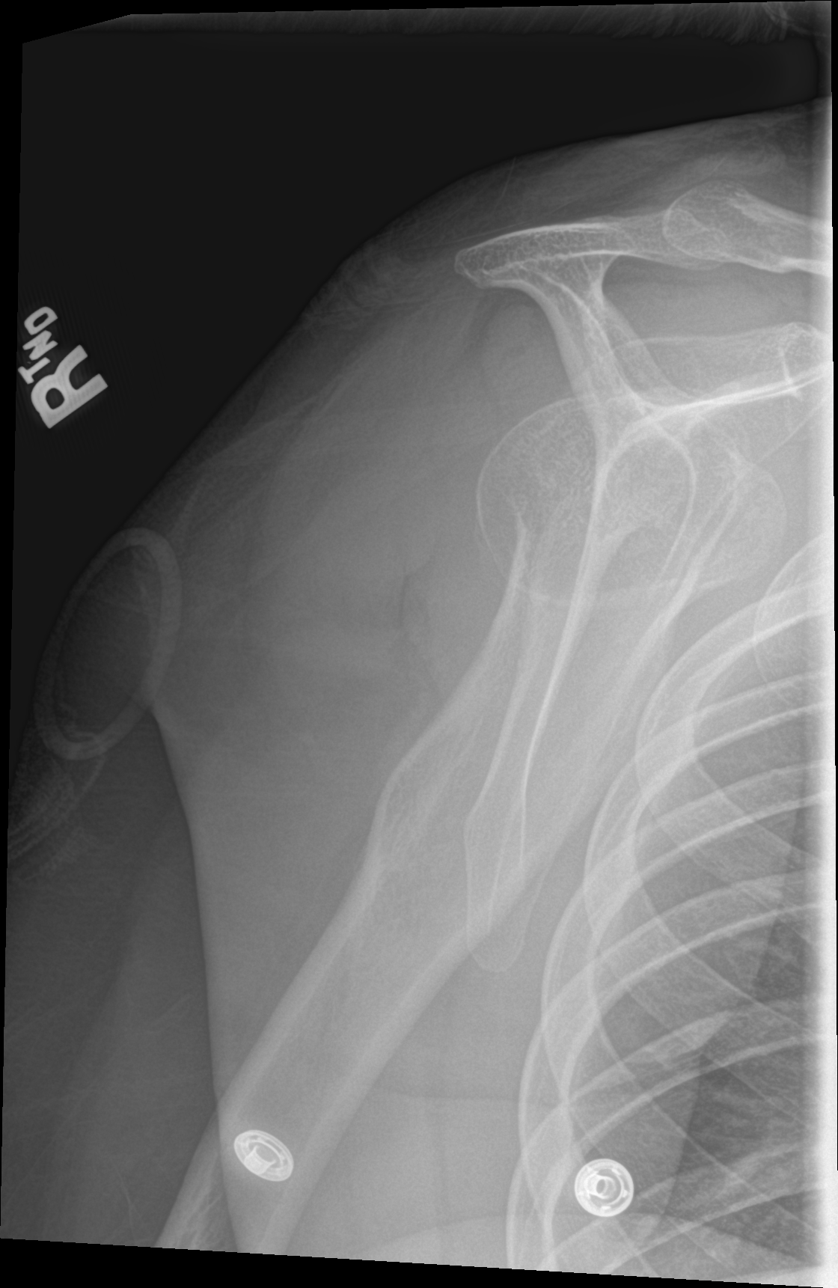

[Series 3: shoulder ap · 0.14mm/px · 2 of 2 slices shown (2 of 2)]
[im 1/2]
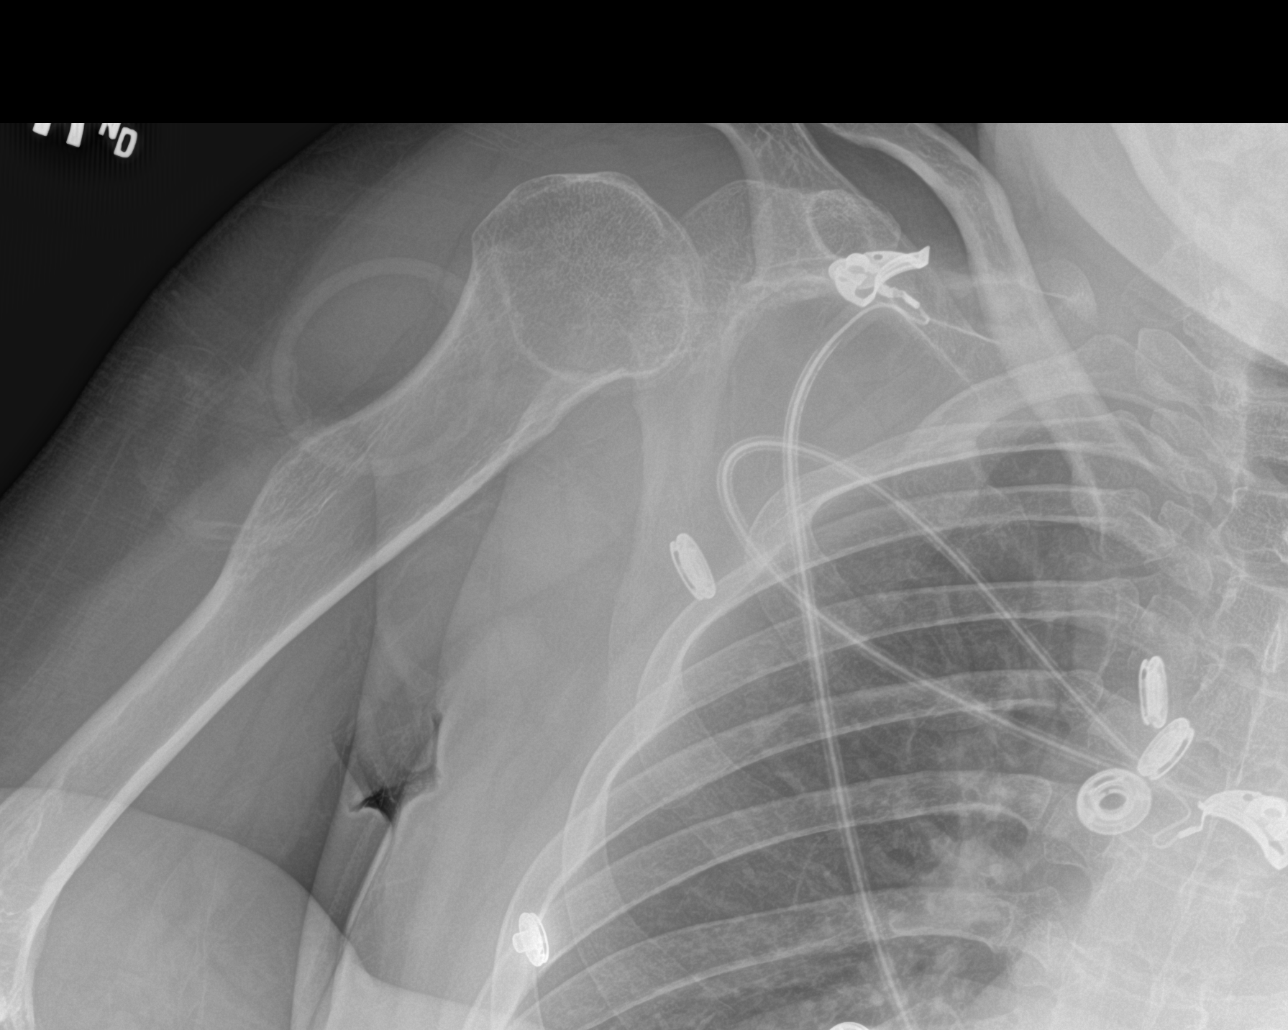
[im 2/2]
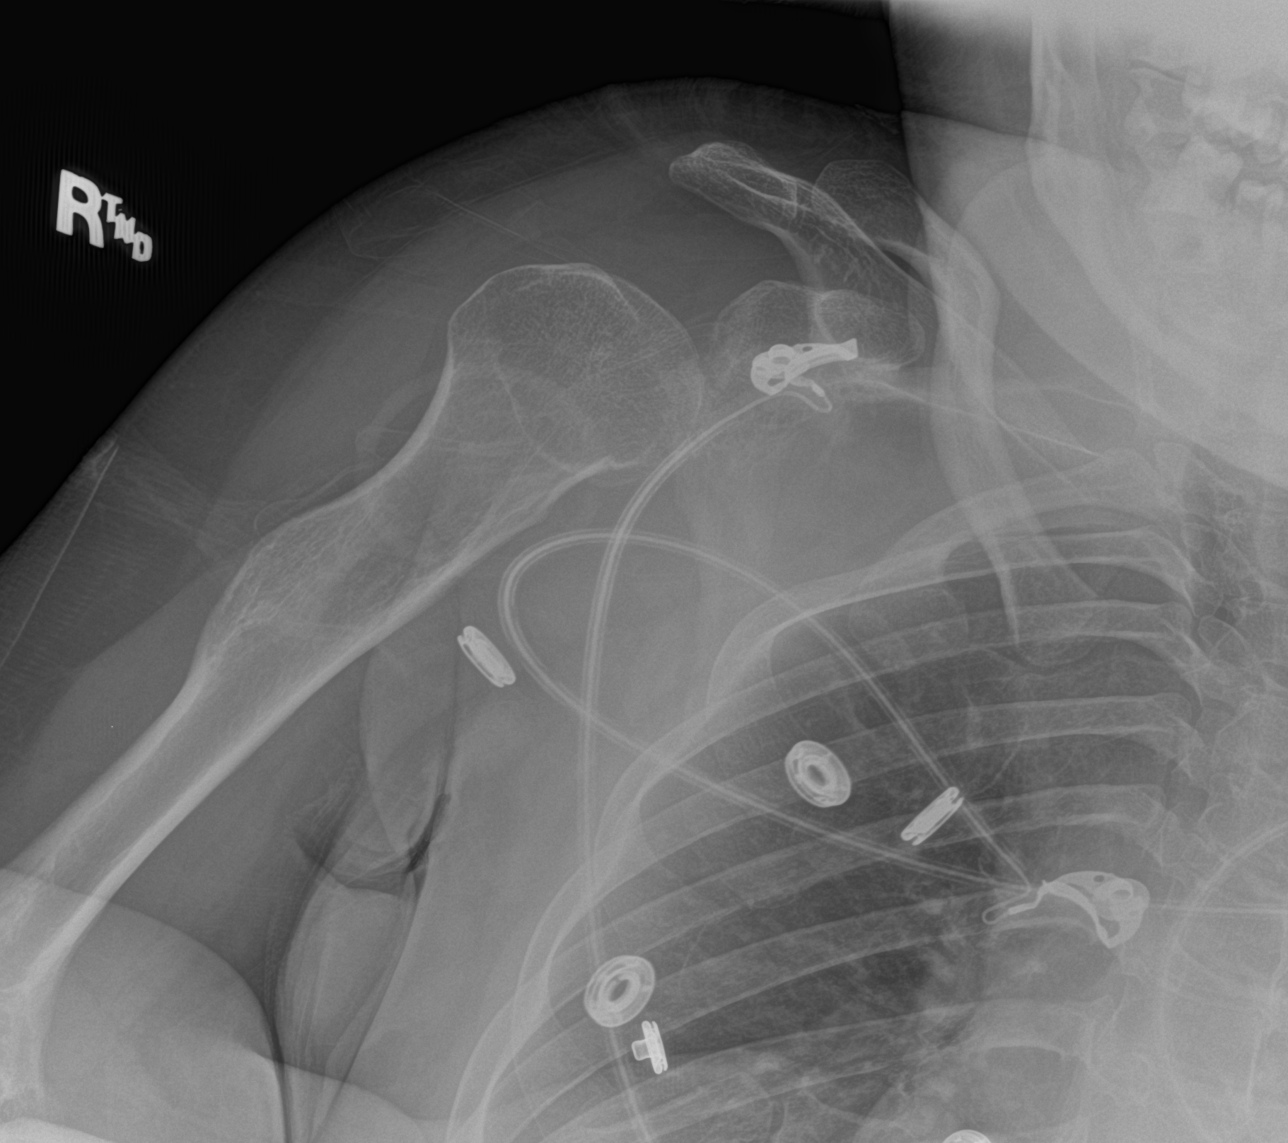

[4 of 4 positions shown; findings below may reference images not displayed]

FINDINGS: No residual dislocation. Humeral head is mildly subluxed inferiorly
in relation to the glenoid, but not dislocated, and subluxed to a
lesser degree than on the earlier exam.

No fracture.  AC joint is normally spaced and aligned.
IMPRESSION: 1. Less inferior subluxation of the humeral head in relation to the
glenoid when compared to the earlier exam. No dislocation. No
fracture
